# Patient Record
Sex: Female | Born: 1980 | Race: White | Hispanic: No | Marital: Married | Smoking: Never smoker
Health system: Southern US, Community
[De-identification: ages and names within clinical notes are randomized; demographics above are authoritative.]

## PROBLEM LIST (undated history)

## (undated) ENCOUNTER — Emergency Department (HOSPITAL_COMMUNITY): Admission: EM | Payer: No Typology Code available for payment source | Source: Home / Self Care

## (undated) HISTORY — PX: FRACTURE SURGERY: SHX138

## (undated) HISTORY — PX: CHOLECYSTECTOMY: SHX55

---

## 2020-01-26 ENCOUNTER — Emergency Department (HOSPITAL_COMMUNITY)
Admission: EM | Admit: 2020-01-26 | Discharge: 2020-01-26 | Disposition: A | Discharge status: Home / Self Care | Payer: Medicaid - Out of State | Attending: Emergency Medicine | Admitting: Emergency Medicine

## 2020-01-26 ENCOUNTER — Encounter (HOSPITAL_COMMUNITY): Payer: Self-pay | Admitting: *Deleted

## 2020-01-26 ENCOUNTER — Other Ambulatory Visit: Payer: Self-pay

## 2020-01-26 DIAGNOSIS — K047 Periapical abscess without sinus: Secondary | ICD-10-CM | POA: Diagnosis present

## 2020-01-26 MED ORDER — NAPROXEN 375 MG PO TABS: 375.0000 mg | ORAL_TABLET | Freq: Two times a day (BID) | ORAL | 0 refills | Status: DC

## 2020-01-26 MED ORDER — AMOXICILLIN 500 MG PO CAPS: 500.0000 mg | ORAL_CAPSULE | Freq: Three times a day (TID) | ORAL | 0 refills | Status: DC

## 2020-01-26 NOTE — ED Provider Notes (Signed)
[facility]-EMERGENCY DEPT Provider Note   CSN: [id number] Arrival date & time: 01/26/20  1652     History Chief Complaint  Patient presents with  . Dental Problem    Ebony Hunt is a 39 y.o. female who presents emergency department chief complaint of dental abscess. She has three broken teeth on the right upper side. She started having some pain in it yesterday and then developed facial swelling of the upper cheek and eyelid today. This is consistent with previous dental abscesses she has had there in the past. She has a Education officer, community in [place] but is currently appear visiting. She would just like an antibiotic. She denies fevers chills or difficulty swallowing  HPI     History reviewed. No pertinent past medical history.  There are no problems to display for this patient.   Past Surgical History:  Procedure Laterality Date  . CHOLECYSTECTOMY    . FRACTURE SURGERY     lt humerus     OB History   No obstetric history on file.     History reviewed. No pertinent family history.  Social History   Tobacco Use  . Smoking status: Not on file  Substance Use Topics  . Alcohol use: Not on file  . Drug use: Not on file    Home Medications Prior to Admission medications   Not on File    Allergies    Ciprofloxacin, Codeine, and Morphine and related  Review of Systems   Review of Systems Ten systems reviewed and are negative for acute change, except as noted in the HPI.   Physical Exam Updated Vital Signs BP 136/89 (BP Location: Right Arm)   Pulse 98   Temp 99 F (37.2 C) (Oral)   Resp 18   SpO2 95%   Physical Exam Vitals and nursing note reviewed.  Constitutional:      General: She is not in acute distress.    Appearance: She is well-developed. She is not diaphoretic.  HENT:     Head: Normocephalic and atraumatic.     Comments: Swelling of the right cheek Multiple decayed teeth down to the gumline with swelling of the gingiva  and erythema, tenderness to palpation, no obvious fluctuance Eyes:     General: No scleral icterus.    Extraocular Movements: Extraocular movements intact.     Conjunctiva/sclera: Conjunctivae normal.     Pupils: Pupils are equal, round, and reactive to light.  Cardiovascular:     Rate and Rhythm: Normal rate and regular rhythm.     Heart sounds: Normal heart sounds. No murmur heard.  No friction rub. No gallop.   Pulmonary:     Effort: Pulmonary effort is normal. No respiratory distress.     Breath sounds: Normal breath sounds.  Abdominal:     General: Bowel sounds are normal. There is no distension.     Palpations: Abdomen is soft. There is no mass.     Tenderness: There is no abdominal tenderness. There is no guarding.  Musculoskeletal:     Cervical back: Normal range of motion.  Skin:    General: Skin is warm and dry.  Neurological:     Mental Status: She is alert and oriented to person, place, and time.  Psychiatric:        Behavior: Behavior normal.     ED Results / Procedures / Treatments   Labs (all labs ordered are listed, but only abnormal results are displayed) Labs Reviewed - No data to display  EKG None  Radiology No results found.  Procedures Procedures (including critical care time)  Medications Ordered in ED Medications - No data to display  ED Course  I have reviewed the triage vital signs and the nursing notes.  Pertinent labs & imaging results that were available during my care of the patient were reviewed by me and considered in my medical decision making (see chart for details).    MDM Rules/Calculators/A&P                          Patient with developing dental abscess. No drainable abscess, no obvious signs of Ludwick's angina. Will discharge with antibiotics and naproxen. Discussed return precautions. Patient appears appropriate for discharge at this time Final Clinical Impression(s) / ED Diagnoses Final diagnoses:  None    Rx / DC  Orders ED Discharge Orders    None       Arthor Captain, PA-C 01/26/20 1750    Pollyann Savoy, MD 01/26/20 1840

## 2020-01-26 NOTE — Discharge Instructions (Signed)

## 2020-01-26 NOTE — ED Triage Notes (Signed)
Pt woke up with facial swelling to the right side of her face from a dental abscess. Pt states that she isn't having pain, but the swelling prior to coming in had her eye shut.

## 2020-02-22 ENCOUNTER — Other Ambulatory Visit: Payer: Self-pay

## 2020-02-23 ENCOUNTER — Emergency Department (HOSPITAL_COMMUNITY)
Admission: EM | Admit: 2020-02-23 | Discharge: 2020-02-23 | Discharge status: Against Medical Advice | Payer: Medicaid - Out of State

## 2020-02-23 NOTE — ED Triage Notes (Signed)
Pt states that she is not going to stay due to wait times  

## 2020-02-28 ENCOUNTER — Encounter (HOSPITAL_COMMUNITY): Payer: Self-pay | Admitting: Emergency Medicine

## 2020-02-28 ENCOUNTER — Other Ambulatory Visit: Payer: Self-pay

## 2020-02-28 ENCOUNTER — Emergency Department (HOSPITAL_COMMUNITY)
Admission: EM | Admit: 2020-02-28 | Discharge: 2020-02-28 | Disposition: A | Discharge status: Home / Self Care | Payer: Medicaid - Out of State | Attending: Emergency Medicine | Admitting: Emergency Medicine

## 2020-02-28 DIAGNOSIS — M545 Low back pain, unspecified: Secondary | ICD-10-CM | POA: Insufficient documentation

## 2020-02-28 DIAGNOSIS — R0781 Pleurodynia: Secondary | ICD-10-CM | POA: Insufficient documentation

## 2020-02-28 DIAGNOSIS — F17210 Nicotine dependence, cigarettes, uncomplicated: Secondary | ICD-10-CM | POA: Diagnosis not present

## 2020-02-28 DIAGNOSIS — D649 Anemia, unspecified: Secondary | ICD-10-CM | POA: Insufficient documentation

## 2020-02-28 DIAGNOSIS — R062 Wheezing: Secondary | ICD-10-CM | POA: Insufficient documentation

## 2020-02-28 LAB — BASIC METABOLIC PANEL
Anion gap: 11 (ref 5–15)
BUN: 11 mg/dL (ref 6–20)
CO2: 23 mmol/L (ref 22–32)
Calcium: 8.8 mg/dL — ABNORMAL LOW (ref 8.9–10.3)
Chloride: 105 mmol/L (ref 98–111)
Creatinine, Ser: 0.53 mg/dL (ref 0.44–1.00)
GFR, Estimated: >60 mL/min (ref >60)
Glucose, Bld: 85 mg/dL (ref 70–99)
Potassium: 4 mmol/L (ref 3.5–5.1)
Sodium: 139 mmol/L (ref 135–145)

## 2020-02-28 LAB — URINALYSIS, ROUTINE W REFLEX MICROSCOPIC
Bilirubin Urine: NEGATIVE
Glucose, UA: NEGATIVE mg/dL
Hgb urine dipstick: NEGATIVE
Ketones, ur: NEGATIVE mg/dL
Leukocytes,Ua: NEGATIVE
Nitrite: NEGATIVE
Protein, ur: NEGATIVE mg/dL
Specific Gravity, Urine: 1.019 (ref 1.005–1.030)
pH: 7 (ref 5.0–8.0)

## 2020-02-28 LAB — CBC WITH DIFFERENTIAL/PLATELET
Abs Immature Granulocytes: 0.03 10*3/uL (ref 0.00–0.07)
Basophils Absolute: 0.1 10*3/uL (ref 0.0–0.1)
Basophils Relative: 1 %
Eosinophils Absolute: 0.3 10*3/uL (ref 0.0–0.5)
Eosinophils Relative: 3 %
HCT: 30.3 % — ABNORMAL LOW (ref 36.0–46.0)
Hemoglobin: 7.7 g/dL — ABNORMAL LOW (ref 12.0–15.0)
Immature Granulocytes: 0 %
Lymphocytes Relative: 40 %
Lymphs Abs: 3.6 10*3/uL (ref 0.7–4.0)
MCH: 17.2 pg — ABNORMAL LOW (ref 26.0–34.0)
MCHC: 25.4 g/dL — ABNORMAL LOW (ref 30.0–36.0)
MCV: 67.8 fL — ABNORMAL LOW (ref 80.0–100.0)
Monocytes Absolute: 0.9 10*3/uL (ref 0.1–1.0)
Monocytes Relative: 10 %
Neutro Abs: 4.2 10*3/uL (ref 1.7–7.7)
Neutrophils Relative %: 46 %
Platelets: 370 10*3/uL (ref 150–400)
RBC: 4.47 MIL/uL (ref 3.87–5.11)
RDW: 19.9 % — ABNORMAL HIGH (ref 11.5–15.5)
WBC: 9.1 10*3/uL (ref 4.0–10.5)
nRBC: 0 % (ref 0.0–0.2)

## 2020-02-28 LAB — PREGNANCY, URINE: Preg Test, Ur: NEGATIVE

## 2020-02-28 MED ORDER — METHOCARBAMOL 500 MG PO TABS: 500.0000 mg | ORAL_TABLET | Freq: Two times a day (BID) | ORAL | 0 refills | Status: DC

## 2020-02-28 MED ORDER — NAPROXEN 500 MG PO TABS: 500.0000 mg | ORAL_TABLET | Freq: Two times a day (BID) | ORAL | 0 refills | Status: DC

## 2020-02-28 NOTE — ED Triage Notes (Signed)
Pt states that she has had lower back pain bilaterally x 2 days. Denies vaginal discharge or urinary symptoms. Alert and oriented.

## 2020-02-28 NOTE — ED Provider Notes (Addendum)
[facility]-EMERGENCY DEPT Provider Note   CSN: [id number] Arrival date & time: 02/28/20  1044     History Chief Complaint  Patient presents with  . Back Pain    Ebony Hunt is a 39 y.o. female.  39 year old female with no significant past medical history, daily smoker, presents with complaint of low back pain as well as pain in her right ribs. Symptoms started 2 days ago upon waking, denies falls or injuries. Pain is worse with movement and palpation, has not taken anything for her pain. Denies fevers, chills, cough/URI symptoms, changes in bowel or bladder habits. No other complaints or concerns.         History reviewed. No pertinent past medical history.  There are no problems to display for this patient.   Past Surgical History:  Procedure Laterality Date  . CHOLECYSTECTOMY    . FRACTURE SURGERY     lt humerus     OB History   No obstetric history on file.     History reviewed. No pertinent family history.  Social History   Tobacco Use  . Smoking status: Not on file  Substance Use Topics  . Alcohol use: Not on file  . Drug use: Not on file    Home Medications Prior to Admission medications   Medication Sig Start Date End Date Taking? Authorizing Provider  amoxicillin (AMOXIL) 500 MG capsule Take 1 capsule (500 mg total) by mouth 3 (three) times daily. 01/26/20   Arthor Captain, PA-C  methocarbamol (ROBAXIN) 500 MG tablet Take 1 tablet (500 mg total) by mouth 2 (two) times daily. 02/28/20   Jeannie Fend, PA-C  naproxen (NAPROSYN) 500 MG tablet Take 1 tablet (500 mg total) by mouth 2 (two) times daily. 02/28/20   Jeannie Fend, PA-C    Allergies    Ciprofloxacin, Codeine, and Morphine and related  Review of Systems   Review of Systems  Constitutional: Negative for chills, diaphoresis and fever.  Respiratory: Negative for cough and shortness of breath.   Cardiovascular: Negative for chest pain.  Gastrointestinal:  Negative for abdominal pain, constipation, diarrhea, nausea and vomiting.  Genitourinary: Negative for dysuria and frequency.  Musculoskeletal: Positive for back pain and myalgias. Negative for gait problem, neck pain and neck stiffness.  Skin: Negative for rash and wound.  Allergic/Immunologic: Negative for immunocompromised state.  Neurological: Negative for weakness and numbness.  Hematological: Negative for adenopathy.  All other systems reviewed and are negative.   Physical Exam Updated Vital Signs BP (!) 142/108 (BP Location: Left Arm)   Pulse 80   Temp 98.1 F (36.7 C) (Oral)   Resp 14   Ht 5\' 5"  (1.651 m)   Wt 83.9 kg   LMP 02/15/2020 (Exact Date)   SpO2 100%   BMI 30.79 kg/m   Physical Exam Vitals and nursing note reviewed.  Constitutional:      General: She is not in acute distress.    Appearance: She is well-developed. She is not diaphoretic.  HENT:     Head: Normocephalic and atraumatic.     Mouth/Throat:     Mouth: Mucous membranes are moist.  Eyes:     Conjunctiva/sclera: Conjunctivae normal.  Cardiovascular:     Rate and Rhythm: Normal rate and regular rhythm.     Pulses: Normal pulses.     Heart sounds: Normal heart sounds.  Pulmonary:     Effort: Pulmonary effort is normal.     Breath sounds: Wheezing present.  Musculoskeletal:        General: Tenderness present. No swelling or deformity. Normal range of motion.     Cervical back: No tenderness or bony tenderness.     Thoracic back: Tenderness present. No bony tenderness.     Lumbar back: Tenderness present. No bony tenderness.       Back:  Skin:    General: Skin is warm and dry.     Findings: No erythema or rash.  Neurological:     Mental Status: She is alert and oriented to person, place, and time.     Motor: No weakness.  Psychiatric:        Behavior: Behavior normal.     ED Results / Procedures / Treatments   Labs (all labs ordered are listed, but only abnormal results are  displayed) Labs Reviewed  BASIC METABOLIC PANEL - Abnormal; Notable for the following components:      Result Value   Calcium 8.8 (*)    All other components within normal limits  CBC WITH DIFFERENTIAL/PLATELET - Abnormal; Notable for the following components:   Hemoglobin 7.7 (*)    HCT 30.3 (*)    MCV 67.8 (*)    MCH 17.2 (*)    MCHC 25.4 (*)    RDW 19.9 (*)    All other components within normal limits  URINALYSIS, ROUTINE W REFLEX MICROSCOPIC - Abnormal; Notable for the following components:   APPearance HAZY (*)    All other components within normal limits  RESPIRATORY PANEL BY RT PCR (FLU A&B, COVID)  PREGNANCY, URINE    EKG None  Radiology No results found.  Procedures Procedures (including critical care time)  Medications Ordered in ED Medications - No data to display  ED Course  I have reviewed the triage vital signs and the nursing notes.  Pertinent labs & imaging results that were available during my care of the patient were reviewed by me and considered in my medical decision making (see chart for details).  Clinical Course as of Feb 27 1513  Sat Feb 28, 2020  [id number] 39 year old female with complaint of low back pain, on exam found to have tenderness to right and left lower back as well as right lower ribs. No midline/bony spine tenderness. CBC with hgb 7.7. On further discussion with patient, states she does have a history of IDA, does not currently take an iron supplement, has been treated with iron infusions in the past. States her prior hgb was 5.3. Reviewed with Dr. Effie Shy, recommend PO iron supplement, stop smoking. Will treat her back pain with NSAID and muscle relaxant, referral to PCP as she is relocating to this area.    [LM]    Clinical Course User Index [LM] [name]   MDM Rules/Calculators/A&P                          Final Clinical Impression(s) / ED Diagnoses Final diagnoses:  Acute bilateral low back pain without sciatica   Anemia, unspecified type    Rx / DC Orders ED Discharge Orders         Ordered    naproxen (NAPROSYN) 500 MG tablet  2 times daily        02/28/20 1506    methocarbamol (ROBAXIN) 500 MG tablet  2 times daily        02/28/20 1506           Jeannie Fend, PA-C 02/28/20  1514    Jeannie Fend, PA-C 02/28/20 1514    Mancel Bale, MD 02/29/20 [phone]

## 2020-02-28 NOTE — Discharge Instructions (Addendum)
Warm compresses for 20 minutes at a time followed by gentle stretching. Take naproxen and Robaxin as needed as prescribed for low back pain. Recommend daily iron supplement. Follow-up with primary care provider, given referral to local clinic.

## 2020-02-28 NOTE — ED Notes (Signed)
Patient refusing respiratory panel, PA made aware.

## 2020-09-06 ENCOUNTER — Encounter (HOSPITAL_COMMUNITY): Payer: Self-pay

## 2020-09-06 ENCOUNTER — Other Ambulatory Visit: Payer: Self-pay

## 2020-09-06 ENCOUNTER — Ambulatory Visit (HOSPITAL_COMMUNITY)
Admission: EM | Admit: 2020-09-06 | Discharge: 2020-09-06 | Disposition: A | Discharge status: Home / Self Care | Payer: Medicaid - Out of State | Attending: Student | Admitting: Student

## 2020-09-06 ENCOUNTER — Ambulatory Visit (INDEPENDENT_AMBULATORY_CARE_PROVIDER_SITE_OTHER): Payer: Medicaid - Out of State

## 2020-09-06 DIAGNOSIS — Z8781 Personal history of (healed) traumatic fracture: Secondary | ICD-10-CM

## 2020-09-06 DIAGNOSIS — S42201S Unspecified fracture of upper end of right humerus, sequela: Secondary | ICD-10-CM

## 2020-09-06 DIAGNOSIS — M898X2 Other specified disorders of bone, upper arm: Secondary | ICD-10-CM

## 2020-09-06 MED ORDER — TIZANIDINE HCL 2 MG PO CAPS: 2.0000 mg | ORAL_CAPSULE | Freq: Three times a day (TID) | ORAL | 0 refills | Status: DC

## 2020-09-06 MED ORDER — MELOXICAM 7.5 MG PO TABS: 7.5000 mg | ORAL_TABLET | Freq: Every day | ORAL | 0 refills | Status: DC

## 2020-09-06 NOTE — ED Triage Notes (Signed)
Pt has pain in her right shoulder and arm x 5 days. Patient states that she had a previous fracture in that arm with rod placement. Denies any new injury.

## 2020-09-06 NOTE — ED Provider Notes (Signed)
[facility]    CSN: [id number] Arrival date & time: 09/06/20  1607      History   Chief Complaint Chief Complaint  Patient presents with  . Arm Injury    HPI Ebony Hunt is a 40 y.o. female presenting with R shoulder and arm pain x5 days. history fracture and rod placement in this area. 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 Describes pain diffusely along outer distal humerus, under surgical scar. Some decreased sensation just medial to scar.   HPI  History reviewed. No pertinent past medical history.  There are no problems to display for this patient.   Past Surgical History:  Procedure Laterality Date  . CHOLECYSTECTOMY    . FRACTURE SURGERY     lt humerus    OB History   No obstetric history on file.      Home Medications    Prior to Admission medications   Medication Sig Start Date End Date Taking? Authorizing Provider  meloxicam (MOBIC) 7.5 MG tablet Take 1 tablet (7.5 mg total) by mouth daily. 09/06/20  Yes Rhys Martini, PA-C  tizanidine (ZANAFLEX) 2 MG capsule Take 1 capsule (2 mg total) by mouth 3 (three) times daily. 09/06/20  Yes Rhys Martini, PA-C  amoxicillin (AMOXIL) 500 MG capsule Take 1 capsule (500 mg total) by mouth 3 (three) times daily. 01/26/20   Arthor Captain, PA-C    Family History Family History  Problem Relation Age of Onset  . Healthy Mother   . Healthy Father     Social History Social History   Tobacco Use  . Smoking status: Never Smoker  . Smokeless tobacco: Never Used     Allergies   Ciprofloxacin, Codeine, and Morphine and related   Review of Systems Review of Systems  Musculoskeletal:       R arm pain  All other systems reviewed and are negative.    Physical Exam Triage Vital Signs ED Triage Vitals [09/06/20 1655]  Enc Vitals Group     BP      Pulse      Resp      Temp      Temp src      SpO2      Weight      Height      Head  Circumference      Peak Flow      Pain Score 8     Pain Loc      Pain Edu?      Excl. in GC?    No data found.  Updated Vital Signs BP 121/81   Pulse 81   Temp 98.4 F (36.9 C)   Resp 19   LMP 09/06/2020   SpO2 96%   Visual Acuity Right Eye Distance:   Left Eye Distance:   Bilateral Distance:    Right Eye Near:   Left Eye Near:    Bilateral Near:     Physical Exam Vitals reviewed.  Constitutional:      General: She is not in acute distress.    Appearance: Normal appearance. She is not ill-appearing or diaphoretic.  HENT:     Head: Normocephalic and atraumatic.  Cardiovascular:     Rate and Rhythm: Normal rate and regular rhythm.     Heart sounds: Normal heart sounds.  Pulmonary:     Effort: Pulmonary effort is normal.     Breath sounds: Normal breath sounds.  Musculoskeletal:     Right upper arm: Tenderness and bony tenderness present.     Comments: R humerus diffusely tender to palpation, worse over lateral aspect. Tenderness is in distribution of surgical scar. Sensation intact. ROM shoulder and elbow intact and without pain. Grip strength 5/5, radial pulse 2+, cap refill <2 seconds.  Skin:    General: Skin is warm.     Capillary Refill: Capillary refill takes less than 2 seconds.  Neurological:     General: No focal deficit present.     Mental Status: She is alert and oriented to person, place, and time.  Psychiatric:        Mood and Affect: Mood normal.        Behavior: Behavior normal.        Thought Content: Thought content normal.        Judgment: Judgment normal.      UC Treatments / Results  Labs (all labs ordered are listed, but only abnormal results are displayed) Labs Reviewed - No data to display  EKG   Radiology DG Humerus Right  Result Date: 09/06/2020 CLINICAL DATA:  Distal humerus tenderness, fracture over 10 years ago, no recent injury EXAM: RIGHT HUMERUS - 2+ VIEW COMPARISON:  None. FINDINGS: No fracture or dislocation of the  right humerus. There is plate and screw fixation of the mid to distal humerus. No evidence of perihardware fracture or loosening. Soft tissues are unremarkable. IMPRESSION: No fracture or dislocation of the right humerus. There is plate and screw fixation of the mid to distal humerus. No evidence of perihardware fracture or loosening. Electronically Signed   By: Lauralyn Primes M.D.   On: 09/06/2020 17:57    Procedures Procedures (including critical care time)  Medications Ordered in UC Medications - No data to display  Initial Impression / Assessment and Plan / UC Course  I have reviewed the triage vital signs and the nursing notes.  Pertinent labs & imaging results that were available during my care of the patient were reviewed by me and considered in my medical decision making (see chart for details).     This patient is a 40 year old female presenting with R humerus pain, nontraumatic. Neurovascularly intact. History humeral fracture and surgical repair with rod >10 years ago.   Xray R humerus-No fracture or dislocation of the right humerus. There is plate and screw fixation of the mid to distal humerus. No evidence of perihardware fracture or loosening.   Reassurance provided. Meloxicam, zanaflex, tylenol, RICE. Sling provided at patient request. F/u with ortho if symptoms persist.   Final Clinical Impressions(s) / UC Diagnoses   Final diagnoses:  Pain of right humerus  History of fracture of humerus     Discharge Instructions     -Meloxicam (mobic) for pain relief, once daily taken with food.  Avoid other NSAIDs while on this medication like ibuprofen, Advil, Motrin.  You can still take Tylenol 1000 mg 3 times daily. -Start the muscle relaxer-Zanaflex (tizanidine), up to 3 times daily for muscle spasms and pain.  This can make you drowsy, so take at bedtime or when you do not need to drive or operate machinery. -Use the sling while you're having pain -Follow-up with ortho if  symptoms persist; information below    ED Prescriptions    Medication Sig Dispense Auth. Provider   meloxicam (MOBIC) 7.5 MG tablet Take 1 tablet (7.5 mg total) by mouth daily. 15 tablet Rhys Martini, PA-C   tizanidine (ZANAFLEX) 2 MG capsule  Take 1 capsule (2 mg total) by mouth 3 (three) times daily. 21 capsule Rhys Martini, PA-C     I have reviewed the PDMP during this encounter.   Rhys Martini, PA-C 09/06/20 1810

## 2020-09-06 NOTE — Discharge Instructions (Addendum)
-  Meloxicam (mobic) for pain relief, once daily taken with food.  Avoid other NSAIDs while on this medication like ibuprofen, Advil, Motrin.  You can still take Tylenol 1000 mg 3 times daily. -Start the muscle relaxer-Zanaflex (tizanidine), up to 3 times daily for muscle spasms and pain.  This can make you drowsy, so take at bedtime or when you do not need to drive or operate machinery. -Use the sling while you're having pain -Follow-up with ortho if symptoms persist; information below

## 2020-11-17 ENCOUNTER — Other Ambulatory Visit: Payer: Self-pay

## 2020-11-17 ENCOUNTER — Emergency Department
Admission: EM | Admit: 2020-11-17 | Discharge: 2020-11-17 | Disposition: A | Discharge status: Home / Self Care | Payer: Medicaid - Out of State | Attending: Emergency Medicine | Admitting: Emergency Medicine

## 2020-11-17 DIAGNOSIS — M545 Low back pain, unspecified: Secondary | ICD-10-CM

## 2020-11-17 MED ORDER — PREDNISONE 20 MG PO TABS
20.0000 mg | ORAL_TABLET | Freq: Every day | ORAL | 0 refills | Status: DC
Start: 2020-11-17 — End: 2021-05-19

## 2020-11-17 MED ORDER — CYCLOBENZAPRINE HCL 10 MG PO TABS: 10.0000 mg | ORAL_TABLET | Freq: Three times a day (TID) | ORAL | 0 refills | Status: DC | PRN

## 2020-11-17 MED ORDER — NAPROXEN 500 MG PO TABS: 500.0000 mg | ORAL_TABLET | Freq: Two times a day (BID) | ORAL | 2 refills | Status: DC

## 2020-11-17 MED ORDER — MELOXICAM 7.5 MG PO TABS: 7.5000 mg | ORAL_TABLET | Freq: Every day | ORAL | 0 refills | Status: DC

## 2020-11-17 NOTE — Discharge Instructions (Addendum)
Take meloxicam once a day to help with the pain.  Take the prednisone 1 pill a day also with food.  You can use the Flexeril 3 times a day if needed.  This may help with the pain.  Be careful not to drive on it or operate hazardous machinery on it as it may make you sleepy.    Return for worsening pain, any rash or incontinence or weakness.  Return if not any better in 2 weeks or follow-up with primary care.  You can also schedule an appointment with neurology.  I have included the contact information for Dr. Malvin Johns and Dr. Sherryll Burger but it may take some time to get in with them.

## 2020-11-17 NOTE — ED Provider Notes (Signed)
[facility] Emergency Department Provider Note   ____________________________________________   Event Date/Time   First MD Initiated Contact with Patient 11/17/20 1053     (approximate)  I have reviewed the triage vital signs and the nursing notes.   HISTORY  Chief Complaint  HPI Ebony Hunt is a 40 y.o. female who comes in complaining of burning pain in the left flank area.  She denies any fever chills urinary frequency or urgency.  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  Pain never completely went away but got worse 2 weeks ago without any injury.  She reports when the pain initially started [employer]. decided it was not work-related.  She denies any incontinence.  She does not have any numbness in her perineum.  She denies any leg weakness.  She does not have any radiation of the pain down either leg.  She feels it may be a slipped disc.         History reviewed. No pertinent past medical history. Review the old records shows that patient had pain of the right humerus in May of this year.  She has a history of an injury there with a humeral fracture and surgical repair with a rod greater than 10 years ago.  X-ray done at that visit showed plate and screw fixation of the mid to distal humerus. Patient had a ER visit in October 2021 with pain in the back and lower ribs on the right side and was diagnosed with acute bilateral low back pain without sciatica.  She was treated with Naprosyn and Robaxin at that time. There are no problems to display for this patient.   Past Surgical History:  Procedure Laterality Date   CHOLECYSTECTOMY     FRACTURE SURGERY     lt humerus    Prior to Admission medications   Medication Sig Start Date End Date Taking? Authorizing Provider  cyclobenzaprine (FLEXERIL) 10 MG tablet Take 1 tablet (10 mg total) by mouth 3 (three) times daily as needed for muscle  spasms. 11/17/20  Yes Arnaldo Natal, MD  meloxicam (MOBIC) 7.5 MG tablet Take 1 tablet (7.5 mg total) by mouth daily. 11/17/20 11/17/21 Yes Arnaldo Natal, MD  predniSONE (DELTASONE) 20 MG tablet Take 1 tablet (20 mg total) by mouth daily with breakfast. 11/17/20  Yes Arnaldo Natal, MD  amoxicillin (AMOXIL) 500 MG capsule Take 1 capsule (500 mg total) by mouth 3 (three) times daily. 01/26/20   Arthor Captain, PA-C  meloxicam (MOBIC) 7.5 MG tablet Take 1 tablet (7.5 mg total) by mouth daily. 09/06/20   Rhys Martini, PA-C  tizanidine (ZANAFLEX) 2 MG capsule Take 1 capsule (2 mg total) by mouth 3 (three) times daily. 09/06/20   Rhys Martini, PA-C    Allergies Ciprofloxacin, Codeine, and Morphine and related  Family History  Problem Relation Age of Onset   Healthy Mother    Healthy Father     Social History Social History   Tobacco Use   Smoking status: Never   Smokeless tobacco: Never  History from the October 2021 visit reveals that she was smoking at that time  Review of Systems  Constitutional: No fever/chills Eyes: No visual changes. ENT: No sore throat. Cardiovascular: Denies chest pain. Respiratory: Denies shortness of breath. Gastrointestinal: No abdominal pain.  No nausea, no vomiting.  No diarrhea.  No constipation. Genitourinary: Negative for dysuria. Musculoskeletal: Left-sided  low back pain. Skin: Negative for rash. Neurological: Negative for headaches, focal weakness or numbness.   ____________________________________________   PHYSICAL EXAM:  VITAL SIGNS: ED Triage Vitals  Enc Vitals Group     BP 11/17/20 1006 105/68     Pulse Rate 11/17/20 1006 80     Resp 11/17/20 1006 20     Temp 11/17/20 1006 98.7 F (37.1 C)     Temp Source 11/17/20 1006 Oral     SpO2 11/17/20 1006 98 %     Weight 11/17/20 1006 255 lb (115.7 kg)     Height 11/17/20 1006 5\' 4"  (1.626 m)     Head Circumference --      Peak Flow --      Pain Score 11/17/20 1013 10     Pain  Loc --      Pain Edu? --      Excl. in GC? --     Constitutional: Alert and oriented. Well appearing and in no acute distress. Eyes: Conjunctivae are normal. PERRL. EOMI. Head: Atraumatic. Nose: No congestion/rhinnorhea. Mouth/Throat: Mucous membranes are moist.  Oropharynx non-erythematous. Neck: No stridor.   Cardiovascular: Good peripheral circulation. Respiratory: Normal respiratory effort.  No retractions.  Gastrointestinal: Soft and nontender. No distention. No abdominal bruits. No CVA tenderness. Musculoskeletal: No lower extremity tenderness nor edema.   Neurologic:  Normal speech and language. No gross focal neurologic deficits are appreciated.  I am unable to elicit DTRs in the knees or ankles bilaterally.  Patient does not report any numbness in the legs has no weakness on exam does not report any weakness either.  Straight leg raise is negative bilaterally Skin:  Skin is warm, dry and intact. No rash noted.   ____________________________________________   LABS (all labs ordered are listed, but only abnormal results are displayed)  Labs Reviewed - No data to display ____________________________________________  EKG   ____________________________________________  RADIOLOGY 11/19/20, personally viewed and evaluated these images (plain radiographs) as part of my medical decision making, as well as reviewing the written report by the radiologist.  ED MD interpretation:  Official radiology report(s): No results found.  ____________________________________________   PROCEDURES  Procedure(s) performed (including Critical Care):  Procedures   ____________________________________________   INITIAL IMPRESSION / ASSESSMENT AND PLAN / ED COURSE  Patient wants an MRI of her back.  I told her that with burning pain of the left side of the low back for 2 weeks there is not a need for an MRI right now.  We could possibly do 1 if the symptoms worsen or do  not improve after 2-3 more weeks.  Patient says she wants 1 in fact mans 1 and when I will not give her 1 asked to speak to the charge nurse.  We are currently waiting for her to speak to the charge nurse.     Charge nurse to come and explained again to the patient 1/90 and MRI today.  Patient seems satisfied and is discharged         ____________________________________________   FINAL CLINICAL IMPRESSION(S) / ED DIAGNOSES  Final diagnoses:  Acute left-sided low back pain without sciatica     ED Discharge Orders          Ordered    cyclobenzaprine (FLEXERIL) 10 MG tablet  3 times daily PRN        11/17/20 1208    naproxen (NAPROSYN) 500 MG tablet  2 times daily with meals,   Status:  Discontinued        11/17/20 1208    predniSONE (DELTASONE) 20 MG tablet  Daily with breakfast        11/17/20 1208    meloxicam (MOBIC) 7.5 MG tablet  Daily        11/17/20 1212             Note:  This document was prepared using Dragon voice recognition software and may include unintentional dictation errors.   Arnaldo Natal, MD 11/17/20 [phone]

## 2020-11-17 NOTE — ED Triage Notes (Signed)
Pt comes with c/o left lower back pain the patient states possible slipped disc. Pt denies any injuries.

## 2021-03-23 ENCOUNTER — Other Ambulatory Visit: Payer: Self-pay

## 2021-03-23 ENCOUNTER — Encounter: Payer: Self-pay | Admitting: Emergency Medicine

## 2021-03-23 DIAGNOSIS — R110 Nausea: Secondary | ICD-10-CM | POA: Diagnosis present

## 2021-03-23 DIAGNOSIS — Z5321 Procedure and treatment not carried out due to patient leaving prior to being seen by health care provider: Secondary | ICD-10-CM | POA: Insufficient documentation

## 2021-03-23 DIAGNOSIS — R1013 Epigastric pain: Secondary | ICD-10-CM | POA: Diagnosis not present

## 2021-03-23 NOTE — ED Triage Notes (Signed)
Pt to ED from home c/o nausea and epigastric/chest pain x4 days, pain is sharp, denies vomiting or diarrhea.  States hx of low iron and feels similar too.  Denies SOB, denies urinary changes.  Pt A&Ox4, chest rise even and unlabored, skin WNL and in NAD at this time.

## 2021-03-24 ENCOUNTER — Emergency Department
Admission: EM | Admit: 2021-03-24 | Discharge: 2021-03-24 | Disposition: A | Discharge status: Home / Self Care | Payer: Medicaid - Out of State | Attending: Emergency Medicine | Admitting: Emergency Medicine

## 2021-03-24 LAB — COMPREHENSIVE METABOLIC PANEL
ALT: 20 U/L (ref 0–44)
AST: 21 U/L (ref 15–41)
Albumin: 4.1 g/dL (ref 3.5–5.0)
Alkaline Phosphatase: 51 U/L (ref 38–126)
Anion gap: 7 (ref 5–15)
BUN: 13 mg/dL (ref 6–20)
CO2: 26 mmol/L (ref 22–32)
Calcium: 8.8 mg/dL — ABNORMAL LOW (ref 8.9–10.3)
Chloride: 106 mmol/L (ref 98–111)
Creatinine, Ser: 0.43 mg/dL — ABNORMAL LOW (ref 0.44–1.00)
GFR, Estimated: >60 mL/min (ref >60)
Glucose, Bld: 103 mg/dL — ABNORMAL HIGH (ref 70–99)
Potassium: 3.8 mmol/L (ref 3.5–5.1)
Sodium: 139 mmol/L (ref 135–145)
Total Bilirubin: 0.9 mg/dL (ref 0.3–1.2)
Total Protein: 7.6 g/dL (ref 6.5–8.1)

## 2021-03-24 LAB — TROPONIN I (HIGH SENSITIVITY): Troponin I (High Sensitivity): 2 ng/L (ref <18)

## 2021-03-24 LAB — CBC
HCT: 31.5 % — ABNORMAL LOW (ref 36.0–46.0)
Hemoglobin: 7.9 g/dL — ABNORMAL LOW (ref 12.0–15.0)
MCH: 16.6 pg — ABNORMAL LOW (ref 26.0–34.0)
MCHC: 25.1 g/dL — ABNORMAL LOW (ref 30.0–36.0)
MCV: 66.3 fL — ABNORMAL LOW (ref 80.0–100.0)
Platelets: 271 10*3/uL (ref 150–400)
RBC: 4.75 MIL/uL (ref 3.87–5.11)
RDW: 21.1 % — ABNORMAL HIGH (ref 11.5–15.5)
WBC: 11.2 10*3/uL — ABNORMAL HIGH (ref 4.0–10.5)
nRBC: 0 % (ref 0.0–0.2)

## 2021-03-24 LAB — LIPASE, BLOOD: Lipase: 35 U/L (ref 11–51)

## 2021-05-15 ENCOUNTER — Emergency Department (HOSPITAL_COMMUNITY)
Admission: EM | Admit: 2021-05-15 | Discharge: 2021-05-15 | Disposition: A | Discharge status: Home / Self Care | Payer: Medicaid - Out of State | Attending: Emergency Medicine | Admitting: Emergency Medicine

## 2021-05-15 ENCOUNTER — Encounter (HOSPITAL_COMMUNITY): Payer: Self-pay | Admitting: Emergency Medicine

## 2021-05-15 DIAGNOSIS — M25512 Pain in left shoulder: Secondary | ICD-10-CM | POA: Insufficient documentation

## 2021-05-15 DIAGNOSIS — R519 Headache, unspecified: Secondary | ICD-10-CM | POA: Diagnosis not present

## 2021-05-15 DIAGNOSIS — M549 Dorsalgia, unspecified: Secondary | ICD-10-CM | POA: Insufficient documentation

## 2021-05-15 DIAGNOSIS — M542 Cervicalgia: Secondary | ICD-10-CM | POA: Diagnosis not present

## 2021-05-15 DIAGNOSIS — Y92410 Unspecified street and highway as the place of occurrence of the external cause: Secondary | ICD-10-CM | POA: Diagnosis not present

## 2021-05-15 MED ORDER — METHOCARBAMOL 500 MG PO TABS: 500.0000 mg | ORAL_TABLET | Freq: Two times a day (BID) | ORAL | 0 refills | Status: DC
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
## 2021-05-19 ENCOUNTER — Other Ambulatory Visit: Payer: Self-pay

## 2021-05-19 ENCOUNTER — Encounter (HOSPITAL_COMMUNITY): Payer: Self-pay | Admitting: Emergency Medicine

## 2021-05-19 ENCOUNTER — Ambulatory Visit (HOSPITAL_COMMUNITY)
Admission: EM | Admit: 2021-05-19 | Discharge: 2021-05-19 | Disposition: A | Discharge status: Home / Self Care | Payer: Medicaid - Out of State | Attending: Emergency Medicine | Admitting: Emergency Medicine

## 2021-05-19 DIAGNOSIS — M25512 Pain in left shoulder: Secondary | ICD-10-CM

## 2021-05-19 DIAGNOSIS — M545 Low back pain, unspecified: Secondary | ICD-10-CM

## 2021-05-19 MED ORDER — PREDNISONE 20 MG PO TABS: 40.0000 mg | ORAL_TABLET | Freq: Every day | ORAL | 0 refills | Status: AC

## 2021-05-19 MED ORDER — KETOROLAC TROMETHAMINE 30 MG/ML IJ SOLN
INTRAMUSCULAR | Status: AC
  Filled 2021-05-19: qty 1

## 2021-05-19 MED ORDER — KETOROLAC TROMETHAMINE 30 MG/ML IJ SOLN
30.0000 mg | Freq: Once | INTRAMUSCULAR | Status: AC
  Administered 2021-05-19: 30 mg via INTRAMUSCULAR
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
## 2021-05-19 NOTE — Discharge Instructions (Signed)
Your pain is most likely caused by irritation to the muscles or ligaments.   Beginning tomorrow take prednisone every morning with food for 5 days, while taking this medication may use Tylenol 1000 mg every 6 hours in addition for comfort, please avoid use of ibuprofen, naproxen, Advil, Motrin as this can cause irritation to your stomach or kidneys  May continue use of muscle relaxer for additional comfort  You may use heating pad in 15 minute intervals as needed for additional comfort, or you may find comfort in using ice in 10-15 minutes over affected area  Begin stretching affected area daily for 10 minutes as tolerated to further loosen muscles   When lying down place pillow underneath and between knees for support  Can try sleeping without pillow on firm mattress   Practice good posture: head back, shoulders back, chest forward, pelvis back and weight distributed evenly on both legs  If pain persist after recommended treatment or reoccurs if may be beneficial to follow up with orthopedic specialist for evaluation, this doctor specializes in the bones and can manage your symptoms long-term with options such as but not limited to imaging, medications or physical therapy

## 2021-05-30 ENCOUNTER — Ambulatory Visit
Admission: RE | Admit: 2021-05-30 | Discharge: 2021-05-30 | Disposition: A | Discharge status: Home / Self Care | Payer: Self-pay | Source: Ambulatory Visit | Attending: Chiropractor | Admitting: Chiropractor

## 2021-05-30 ENCOUNTER — Other Ambulatory Visit: Payer: Self-pay | Admitting: Chiropractor

## 2021-05-30 ENCOUNTER — Ambulatory Visit
Admission: RE | Admit: 2021-05-30 | Discharge: 2021-05-30 | Disposition: A | Discharge status: Home / Self Care | Payer: Self-pay | Attending: Chiropractor | Admitting: Chiropractor

## 2021-05-30 DIAGNOSIS — S134XXA Sprain of ligaments of cervical spine, initial encounter: Secondary | ICD-10-CM

## 2021-05-30 DIAGNOSIS — S233XXA Sprain of ligaments of thoracic spine, initial encounter: Secondary | ICD-10-CM

## 2021-05-30 DIAGNOSIS — S338XXA Sprain of other parts of lumbar spine and pelvis, initial encounter: Secondary | ICD-10-CM

## 2021-05-30 DIAGNOSIS — M75102 Unspecified rotator cuff tear or rupture of left shoulder, not specified as traumatic: Secondary | ICD-10-CM

## 2021-07-13 ENCOUNTER — Emergency Department (HOSPITAL_COMMUNITY)
Admission: EM | Admit: 2021-07-13 | Discharge: 2021-07-13 | Disposition: A | Discharge status: Home / Self Care | Payer: Self-pay | Attending: Emergency Medicine | Admitting: Emergency Medicine

## 2021-07-13 ENCOUNTER — Emergency Department (HOSPITAL_COMMUNITY): Payer: Self-pay

## 2021-07-13 ENCOUNTER — Other Ambulatory Visit: Payer: Self-pay

## 2021-07-13 ENCOUNTER — Encounter (HOSPITAL_COMMUNITY): Payer: Self-pay | Admitting: Emergency Medicine

## 2021-07-13 DIAGNOSIS — M546 Pain in thoracic spine: Secondary | ICD-10-CM | POA: Insufficient documentation

## 2021-07-13 DIAGNOSIS — R0602 Shortness of breath: Secondary | ICD-10-CM | POA: Insufficient documentation

## 2021-07-13 MED ORDER — LIDOCAINE 5 % EX PTCH
1.0000 | MEDICATED_PATCH | CUTANEOUS | 0 refills | Status: AC
Start: 2021-07-13 — End: ?

## 2021-07-13 MED ORDER — KETOROLAC TROMETHAMINE 15 MG/ML IJ SOLN
15.0000 mg | Freq: Once | INTRAMUSCULAR | Status: AC
  Administered 2021-07-13: 15 mg via INTRAMUSCULAR
  Filled 2021-07-13: qty 1
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
## 2021-07-13 NOTE — Discharge Instructions (Addendum)
You came to the emergency department today to be evaluated for your back pain.  Your physical exam and x-ray imaging were reassuring.  Due to your pain you received a Toradol injection.  Please refrain from using any NSAID medication such as ibuprofen, Aleve, or meloxicam for the next 24 hours.  After that please use pain medication as outlined below. ? ?Please take Ibuprofen (Advil, motrin) and Tylenol (acetaminophen) to relieve your pain.   ? ?You may take up to 600 MG (3 pills) of normal strength ibuprofen every 8 hours as needed.   ?You make take tylenol, up to 1,000 mg (two extra strength pills) every 8 hours as needed.  ? ?It is safe to take ibuprofen and tylenol at the same time as they work differently.  ? Do not take more than 3,000 mg tylenol in a 24 hour period (not more than one dose every 8 hours.  Please check all medication labels as many medications such as pain and cold medications may contain tylenol.  Do not drink alcohol while taking these medications.  Do not take other NSAID'S while taking ibuprofen (such as aleve or naproxen).  Please take ibuprofen with food to decrease stomach upset. ? ?Get help right away if: ?You develop new bowel or bladder control problems. ?You have unusual weakness or numbness in your arms or legs. ?You feel faint. ?

## 2021-08-27 ENCOUNTER — Encounter (HOSPITAL_COMMUNITY): Payer: Self-pay

## 2021-08-27 ENCOUNTER — Emergency Department (HOSPITAL_COMMUNITY)
Admission: EM | Admit: 2021-08-27 | Discharge: 2021-08-27 | Disposition: A | Discharge status: Home / Self Care | Payer: Self-pay | Attending: Emergency Medicine | Admitting: Emergency Medicine

## 2021-08-27 ENCOUNTER — Other Ambulatory Visit: Payer: Self-pay

## 2021-08-27 DIAGNOSIS — M5416 Radiculopathy, lumbar region: Secondary | ICD-10-CM | POA: Insufficient documentation

## 2021-08-27 MED ORDER — METHOCARBAMOL 500 MG PO TABS: 500.0000 mg | ORAL_TABLET | Freq: Two times a day (BID) | ORAL | 0 refills | Status: AC

## 2021-08-27 NOTE — ED Provider Notes (Signed)
?[facility] DEPT ?Provider Note ? ? ?CSN: [id number] ?Arrival date & time: 08/27/21  1326 ? ?  ? ?History ? ?Chief Complaint  ?Patient presents with  ? Back Pain  ? Leg Swelling  ? ? ?Ebony Hunt is a 41 y.o. female. ? ?41 y/o F with PMH of chronic low back pain with sciatica and lumbar disc herniation presents for low back pain and left ankle swelling. Left lower back pain is sharp in nature, radiates from left lower back, down to ankle and back up. Is intermittent. Pt states she recently saw neurologist in March who diagnosed her with a lumbar disc herniation and started her on a new muscle relaxer (Flexeril) which is providing no relief of her symptoms. Also prescribed celebrex and lidoderm patches which are not providing any relief. Additionally, patient states she has new foot and ankle swelling x2 days. Pt elicits associated complete loss of sensation from the malleoli distally as well as weakness with flexion and extension of the ankle. Denies tingling. Pt denies injury. Denies pain. Denies warmth. Denies erythema. Denies inability to ambulate. On ROS, patient denies fever, chills, sweats, fatigue, HA, vision changes, hearing changes, trouble speaking, SHOB, chest pain, palpitations, abdominal pain, change in bowel habits, change in urinary habits, numbness/tingling/weakness of the UE and contralateral LE. Denies prior episodes of swelling and loss of sensation similar to this. Denies personal or family history of thromboembolic conditions or neurological conditions.  ? ? ?  ? ?Home Medications ?Prior to Admission medications   ?Medication Sig Start Date End Date Taking? Authorizing Provider  ?lidocaine (LIDODERM) 5 % Place 1 patch onto the skin daily. Remove & Discard patch within 12 hours or as directed by MD 07/13/21   Loni Beckwith, PA-C  ?methocarbamol (ROBAXIN) 500 MG tablet Take 1 tablet (500 mg total) by mouth 2 (two) times daily. 08/27/21   Tacy Learn,  PA-C  ?predniSONE (DELTASONE) 20 MG tablet Take 2 tablets (40 mg total) by mouth daily. 05/19/21   Hans Eden, NP  ?   ? ?Allergies    ?Ciprofloxacin, Codeine, Morphine and related, and Shrimp (diagnostic)   ? ?Review of Systems   ?Review of Systems ?Negative except as per HPI ?Physical Exam ?Updated Vital Signs ?BP 135/68 (BP Location: Right Arm)   Pulse 82   Temp 97.6 ?F (36.4 ?C) (Oral)   Resp 18   SpO2 96%  ?Physical Exam ?Vitals and nursing note reviewed.  ?Constitutional:   ?   General: She is not in acute distress. ?   Appearance: She is well-developed. She is not diaphoretic.  ?HENT:  ?   Head: Normocephalic and atraumatic.  ?Cardiovascular:  ?   Pulses: Normal pulses.  ?Pulmonary:  ?   Effort: Pulmonary effort is normal.  ?Musculoskeletal:     ?   General: Tenderness present. No swelling.  ?     Back: ? ?   Right lower leg: No edema.  ?   Left lower leg: No edema.  ?Skin: ?   General: Skin is warm and dry.  ?   Findings: No erythema or rash.  ?Neurological:  ?   Mental Status: She is alert and oriented to person, place, and time.  ?   Sensory: Sensory deficit present.  ?   Motor: Weakness present.  ?   Gait: Gait normal.  ?   Deep Tendon Reflexes: Reflexes normal. Babinski sign absent on the right side. Babinski sign absent on the left side.  ?  Reflex Scores: ?     Patellar reflexes are 1+ on the right side and 1+ on the left side. ?     Achilles reflexes are 1+ on the right side and 1+ on the left side. ?   Comments: Reports loss of sensation from malleoli of ankles to the toes in a sock like pattern. Minimal plantar and dorsi flexion strength however gait is intact.  ?Psychiatric:     ?   Behavior: Behavior normal.  ? ? ?ED Results / Procedures / Treatments   ?Labs ?(all labs ordered are listed, but only abnormal results are displayed) ?Labs Reviewed - No data to display ? ?EKG ?None ? ?Radiology ?No results found. ? ?Procedures ?Procedures  ? ? ?Medications Ordered in ED ?Medications - No  data to display ? ?ED Course/ Medical Decision Making/ A&P ?  ?                        ?Medical Decision Making ? ?42 year old female presents with complaint of loss in sensation in her left foot as well as swelling in her left lower leg/ankle area.  Reports recently diagnosed with herniated lumbar disc, experiencing lightninglike shooting pains from her left lower back down to her foot and back to the lumbar area.  On assessment, reports loss of sensation in a sock light pattern from her malleoli to her toes affecting the left foot only.  Reflexes are symmetric, diminished plantar and dorsi flexion of the left foot.  Knee extension flexion and hip flexion normal on this left side.  Gait intact.  Plan is to discontinue the Flexeril, will switch to Robaxin.  Advised to contact her care team for further management. ? ? ? ? ? ? ? ?Final Clinical Impression(s) / ED Diagnoses ?Final diagnoses:  ?Lumbar radiculopathy  ? ? ?Rx / DC Orders ?ED Discharge Orders   ? ?      Ordered  ?  methocarbamol (ROBAXIN) 500 MG tablet  2 times daily       ? 08/27/21 1441  ? ?  ?  ? ?  ? ? ?  ?Tacy Learn, PA-C ?08/27/21 1453 ? ?  ?Dorie Rank, MD ?08/27/21 1529 ? ?
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
## 2021-08-27 NOTE — Discharge Instructions (Signed)
Robaxin as needed as prescribed.  Follow-up with your care team for further management. ?

## 2021-12-30 ENCOUNTER — Ambulatory Visit: Payer: Self-pay | Attending: Physical Therapy | Admitting: Physical Therapy

## 2022-01-17 ENCOUNTER — Ambulatory Visit: Payer: Self-pay | Attending: Physical Therapy | Admitting: Physical Therapy
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
## 2023-04-15 IMAGING — CR DG CHEST 2V
2 series · 2 of 2 positions shown · non-contrast
Comparison: None.

CLINICAL DATA: Shortness of breath.

EXAM:
CHEST - 2 VIEW

[chest pa]
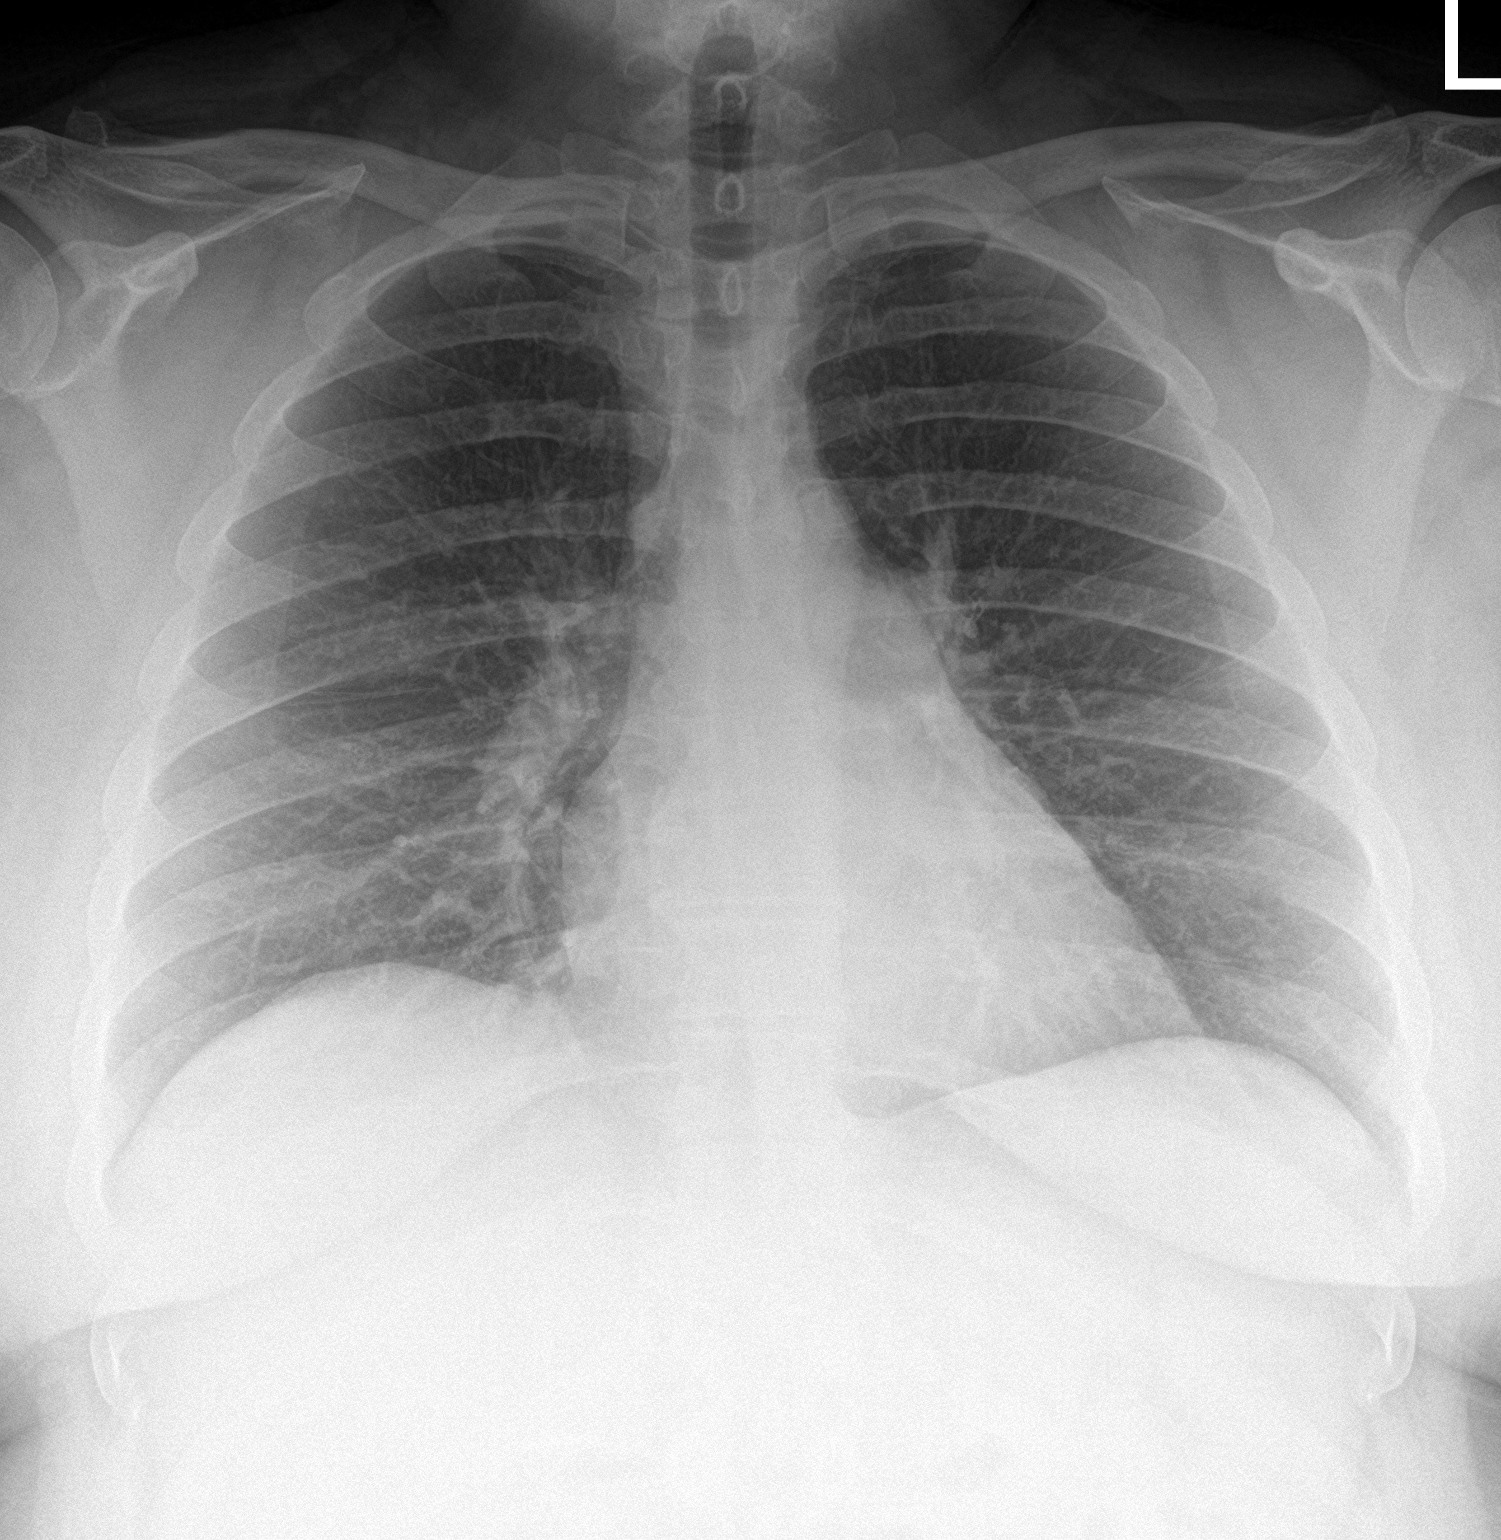

[chest lat]
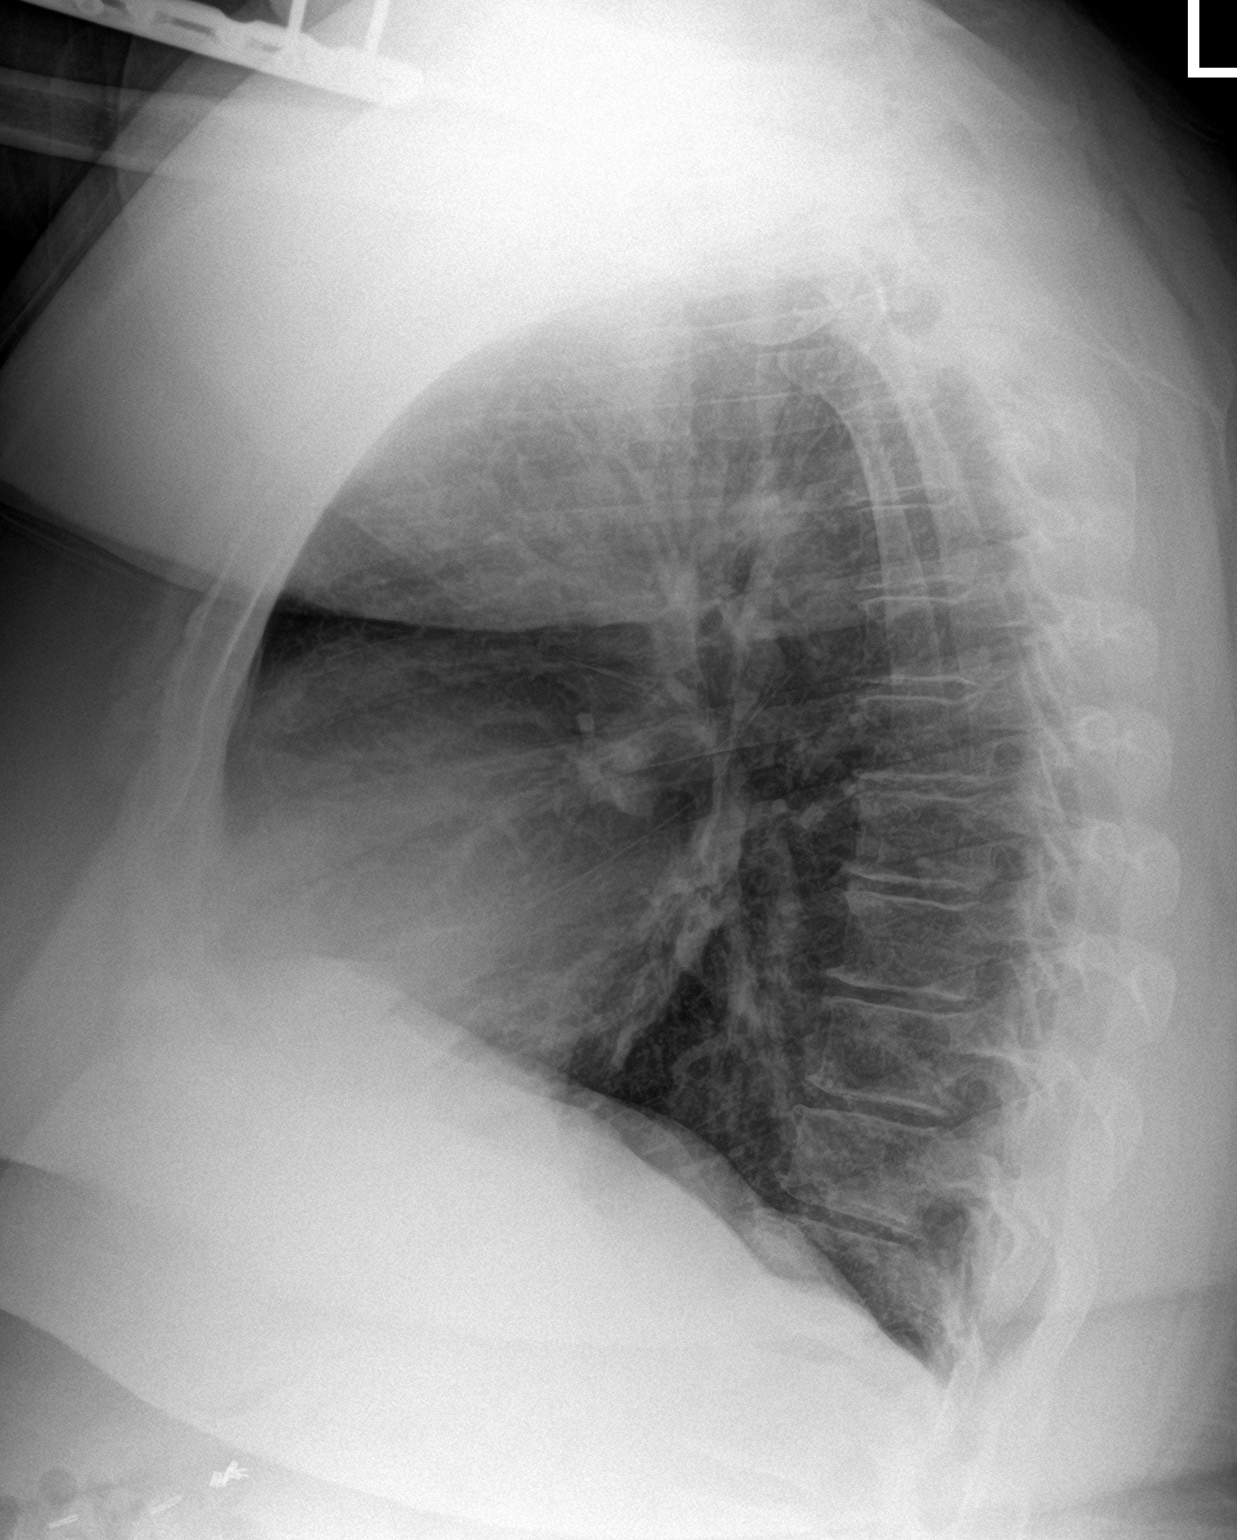

[2 of 2 positions shown; findings below may reference images not displayed]

FINDINGS: The heart size and mediastinal contours are within normal limits.
Both lungs are clear. The visualized skeletal structures are
unremarkable.
IMPRESSION: No active cardiopulmonary disease.
